# Patient Record
Sex: Male | Born: 1960
Health system: Southern US, Community
[De-identification: ages and names within clinical notes are randomized; demographics above are authoritative.]

## PROBLEM LIST (undated history)

## (undated) DIAGNOSIS — I10 Essential (primary) hypertension: Secondary | ICD-10-CM

## (undated) DIAGNOSIS — E7800 Pure hypercholesterolemia, unspecified: Secondary | ICD-10-CM

## (undated) DIAGNOSIS — F419 Anxiety disorder, unspecified: Secondary | ICD-10-CM

---

## 2013-06-14 DIAGNOSIS — E785 Hyperlipidemia, unspecified: Secondary | ICD-10-CM | POA: Insufficient documentation

## 2013-06-14 DIAGNOSIS — R011 Cardiac murmur, unspecified: Secondary | ICD-10-CM | POA: Insufficient documentation

## 2013-06-14 DIAGNOSIS — I10 Essential (primary) hypertension: Secondary | ICD-10-CM | POA: Insufficient documentation

## 2018-12-11 LAB — BASIC METABOLIC PANEL
BUN: 11 (ref 4–21)
Creatinine: 1 (ref 0.6–1.3)
Glucose: 82
Potassium: 3.9 (ref 3.4–5.3)
Sodium: 138 (ref 137–147)

## 2018-12-11 LAB — HEPATIC FUNCTION PANEL
ALT: 46 — AB (ref 10–40)
AST: 30 (ref 14–40)
Alkaline Phosphatase: 71 (ref 25–125)

## 2018-12-11 LAB — LIPID PANEL
Cholesterol: 97 (ref 0–200)
HDL: 38 (ref 35–70)
LDL Cholesterol: 50
Triglycerides: 47 (ref 40–160)

## 2018-12-11 LAB — PSA: PSA: 2.7

## 2018-12-11 LAB — HEMOGLOBIN A1C: Hemoglobin A1C: 5.3

## 2018-12-28 ENCOUNTER — Other Ambulatory Visit: Payer: Self-pay

## 2018-12-28 ENCOUNTER — Emergency Department: Payer: BLUE CROSS/BLUE SHIELD

## 2018-12-28 ENCOUNTER — Encounter: Payer: Self-pay | Admitting: Emergency Medicine

## 2018-12-28 ENCOUNTER — Ambulatory Visit (INDEPENDENT_AMBULATORY_CARE_PROVIDER_SITE_OTHER)
Admission: EM | Admit: 2018-12-28 | Discharge: 2018-12-28 | Disposition: A | Discharge status: Other Acute Inpatient Hospital | Payer: BLUE CROSS/BLUE SHIELD | Source: Home / Self Care | Attending: Family Medicine | Admitting: Family Medicine

## 2018-12-28 ENCOUNTER — Emergency Department
Admission: EM | Admit: 2018-12-28 | Discharge: 2018-12-28 | Disposition: A | Discharge status: Home / Self Care | Payer: BLUE CROSS/BLUE SHIELD | Attending: Emergency Medicine | Admitting: Emergency Medicine

## 2018-12-28 DIAGNOSIS — I10 Essential (primary) hypertension: Secondary | ICD-10-CM | POA: Diagnosis not present

## 2018-12-28 DIAGNOSIS — F419 Anxiety disorder, unspecified: Secondary | ICD-10-CM | POA: Insufficient documentation

## 2018-12-28 DIAGNOSIS — E785 Hyperlipidemia, unspecified: Secondary | ICD-10-CM | POA: Diagnosis not present

## 2018-12-28 DIAGNOSIS — R079 Chest pain, unspecified: Secondary | ICD-10-CM

## 2018-12-28 DIAGNOSIS — R0600 Dyspnea, unspecified: Secondary | ICD-10-CM | POA: Insufficient documentation

## 2018-12-28 DIAGNOSIS — R0789 Other chest pain: Secondary | ICD-10-CM | POA: Diagnosis not present

## 2018-12-28 DIAGNOSIS — Z1159 Encounter for screening for other viral diseases: Secondary | ICD-10-CM | POA: Insufficient documentation

## 2018-12-28 HISTORY — DX: Anxiety disorder, unspecified: F41.9

## 2018-12-28 HISTORY — DX: Essential (primary) hypertension: I10

## 2018-12-28 HISTORY — DX: Pure hypercholesterolemia, unspecified: E78.00

## 2018-12-28 LAB — BRAIN NATRIURETIC PEPTIDE: B Natriuretic Peptide: 13 pg/mL (ref 0.0–100.0)

## 2018-12-28 LAB — BASIC METABOLIC PANEL
Anion gap: 8 (ref 5–15)
BUN: 9 mg/dL (ref 6–20)
CO2: 26 mmol/L (ref 22–32)
Calcium: 9.8 mg/dL (ref 8.9–10.3)
Chloride: 98 mmol/L (ref 98–111)
Creatinine, Ser: 0.88 mg/dL (ref 0.61–1.24)
GFR calc Af Amer: >60 mL/min (ref >60)
GFR calc non Af Amer: >60 mL/min (ref >60)
Glucose, Bld: 94 mg/dL (ref 70–99)
Potassium: 3.8 mmol/L (ref 3.5–5.1)
Sodium: 132 mmol/L — ABNORMAL LOW (ref 135–145)

## 2018-12-28 LAB — CBC
HCT: 46.4 % (ref 39.0–52.0)
Hemoglobin: 15.3 g/dL (ref 13.0–17.0)
MCH: 27.7 pg (ref 26.0–34.0)
MCHC: 33 g/dL (ref 30.0–36.0)
MCV: 83.9 fL (ref 80.0–100.0)
Platelets: 242 10*3/uL (ref 150–400)
RBC: 5.53 MIL/uL (ref 4.22–5.81)
RDW: 12.5 % (ref 11.5–15.5)
WBC: 4.5 10*3/uL (ref 4.0–10.5)
nRBC: 0 % (ref 0.0–0.2)

## 2018-12-28 LAB — TROPONIN I
Troponin I: <0.03 ng/mL (ref <0.03)
Troponin I: <0.03 ng/mL (ref <0.03)

## 2018-12-28 LAB — SARS CORONAVIRUS 2 BY RT PCR (HOSPITAL ORDER, PERFORMED IN ~~LOC~~ HOSPITAL LAB): SARS Coronavirus 2: NEGATIVE

## 2018-12-28 MED ORDER — DIAZEPAM 5 MG PO TABS: 5.0000 mg | ORAL_TABLET | Freq: Three times a day (TID) | ORAL | 0 refills | Status: AC | PRN

## 2018-12-28 MED ORDER — ASPIRIN 81 MG PO CHEW
324.0000 mg | CHEWABLE_TABLET | Freq: Once | ORAL | Status: AC
  Administered 2018-12-28: 324 mg via ORAL

## 2018-12-28 MED ORDER — SODIUM CHLORIDE 0.9% FLUSH: 3.0000 mL | Freq: Once | INTRAVENOUS | Status: DC

## 2018-12-28 MED ORDER — DIAZEPAM 5 MG PO TABS
10.0000 mg | ORAL_TABLET | Freq: Once | ORAL | Status: AC
  Administered 2018-12-28: 10 mg via ORAL
  Filled 2018-12-28: qty 2

## 2018-12-28 NOTE — ED Provider Notes (Signed)
[facility] Emergency Department Provider Note       Time seen: ----------------------------------------- 12:53 PM on 12/28/2018 -----------------------------------------   I have reviewed the triage vital signs and the nursing notes.  HISTORY   Chief Complaint Chest Pain    HPI Anthony Padilla is a 58 y.o. male with a history of anxiety, hyperlipidemia, hypertension who presents to the ED for chest pain since this morning.  Patient reports pain rating into his jaw.  He arrives alert and oriented, received aspirin prior to arrival at [facility] urgent care with some improvement.  Heart rate is normally a little slow.  He reports a history of anxiety and he took anxiety medicines without any improvement.  Current pain is 2 out of 10.  Past Medical History:  Diagnosis Date  . Anxiety   . High cholesterol   . Hypertension     There are no active problems to display for this patient.   History reviewed. No pertinent surgical history.  Allergies Patient has no known allergies.  Social History Social History   Tobacco Use  . Smoking status: Never Smoker  . Smokeless tobacco: Never Used  Substance Use Topics  . Alcohol use: Not Currently  . Drug use: Never   Review of Systems Constitutional: Negative for fever. Cardiovascular: Positive for chest pain Respiratory: Negative for shortness of breath. Gastrointestinal: Negative for abdominal pain, vomiting and diarrhea. Musculoskeletal: Negative for back pain. Skin: Negative for rash. Neurological: Negative for headaches, focal weakness or numbness.  All systems negative/normal/unremarkable except as stated in the HPI  ____________________________________________   PHYSICAL EXAM:  VITAL SIGNS: ED Triage Vitals  Enc Vitals Group     BP 12/28/18 1209 (!) 159/96     Pulse Rate 12/28/18 1209 60     Resp 12/28/18 1207 16     Temp 12/28/18 1209 97.9 F (36.6 C)     Temp Source 12/28/18 1209 Oral    SpO2 12/28/18 1209 98 %     Weight 12/28/18 1208 162 lb (73.5 kg)     Height 12/28/18 1208 5\' 8"  (1.727 m)     Head Circumference --      Peak Flow --      Pain Score 12/28/18 1207 2     Pain Loc --      Pain Edu? --      Excl. in GC? --    Constitutional: Alert and oriented. Well appearing and in no distress. Eyes: Conjunctivae are normal. Normal extraocular movements. ENT      Head: Normocephalic and atraumatic.      Nose: No congestion/rhinnorhea.      Mouth/Throat: Mucous membranes are moist.      Neck: No stridor. Cardiovascular: Normal rate, regular rhythm. No murmurs, rubs, or gallops. Respiratory: Normal respiratory effort without tachypnea nor retractions. Breath sounds are clear and equal bilaterally. No wheezes/rales/rhonchi. Gastrointestinal: Soft and nontender. Normal bowel sounds Musculoskeletal: Nontender with normal range of motion in extremities. No lower extremity tenderness nor edema. Neurologic:  Normal speech and language. No gross focal neurologic deficits are appreciated.  Skin:  Skin is warm, dry and intact. No rash noted. Psychiatric: Mood and affect are normal. Speech and behavior are normal.  ____________________________________________  EKG: Interpreted by me.  Sinus rhythm with rate of 60 bpm, LVH, borderline ST segment and T wave abnormalities, normal axis, normal QT  ____________________________________________  ED COURSE:  As part of my medical decision making, I reviewed the following data within the electronic MEDICAL RECORD NUMBER  History obtained from family if available, nursing notes, old chart and ekg, as well as notes from prior ED visits. Patient presented for chest pain, we will assess with labs and imaging as indicated at this time.   Procedures  Anthony Padilla was evaluated in Emergency Department on 12/28/2018 for the symptoms described in the history of present illness. He was evaluated in the context of the global COVID-19 pandemic, which  necessitated consideration that the patient might be at risk for infection with the SARS-CoV-2 virus that causes COVID-19. Institutional protocols and algorithms that pertain to the evaluation of patients at risk for COVID-19 are in a state of rapid change based on information released by regulatory bodies including the CDC and federal and state organizations. These policies and algorithms were followed during the patient's care in the ED.  ____________________________________________   LABS (pertinent positives/negatives)  Labs Reviewed  BASIC METABOLIC PANEL - Abnormal; Notable for the following components:      Result Value   Sodium 132 (*)    All other components within normal limits  SARS CORONAVIRUS 2 (HOSPITAL ORDER, PERFORMED IN [facility])  CBC  TROPONIN I  BRAIN NATRIURETIC PEPTIDE  TROPONIN I    RADIOLOGY Images were viewed by me  Chest x-ray IMPRESSION: No active cardiopulmonary disease. ____________________________________________   DIFFERENTIAL DIAGNOSIS   Unstable angina, MI, anxiety, musculoskeletal pain  FINAL ASSESSMENT AND PLAN  Chest pain  Plan: The patient had presented for chest pain. Patient's labs are unremarkable including repeat troponin. Patient's imaging was negative.  His EKG is abnormal but he has told me that his EKG is always abnormal.  I will refer him to cardiology for outpatient follow-up.   Anthony DashJohnathan E Kavonte Bearse, MD    Note: This note was generated in part or whole with voice recognition software. Voice recognition is usually quite accurate but there are transcription errors that can and very often do occur. I apologize for any typographical errors that were not detected and corrected.     Emily FilbertWilliams, Daune Divirgilio E, MD 12/28/18 [phone]

## 2018-12-28 NOTE — ED Provider Notes (Signed)
[facility]    CSN: [id number] Arrival date & time: 12/28/18  1014     History   Chief Complaint Chief Complaint  Patient presents with  . Chest Pain    HPI Anthony Padilla is a 58 y.o. male.   58 yo male with a h/o hypertension, hypercholesterolemia and anxiety presents with a c/o chest pains since yesterday. States pain in center of chest and associated with shortness of breath and left shoulder pain. Patient states had one episode yesterday and another one since this morning. Currently pain 2-3/10.    Chest Pain    Past Medical History:  Diagnosis Date  . Anxiety   . High cholesterol   . Hypertension     There are no active problems to display for this patient.   History reviewed. No pertinent surgical history.     Home Medications    Prior to Admission medications   Medication Sig Start Date End Date Taking? Authorizing Provider  ALPRAZolam Prudy Feeler) 0.25 MG tablet Take 0.25 mg by mouth at bedtime as needed for anxiety.   Yes [provider]  rosuvastatin (CRESTOR) 10 MG tablet Take 10 mg by mouth daily.   Yes [provider]  valsartan (DIOVAN) 160 MG tablet Take 160 mg by mouth daily.   Yes [provider]    Family History Family History  Problem Relation Age of Onset  . Hypertension Mother   . Heart failure Father     Social History Social History   Tobacco Use  . Smoking status: Never Smoker  . Smokeless tobacco: Never Used  Substance Use Topics  . Alcohol use: Not Currently  . Drug use: Not on file     Allergies   Patient has no known allergies.   Review of Systems Review of Systems  Cardiovascular: Positive for chest pain.     Physical Exam Triage Vital Signs ED Triage Vitals  Enc Vitals Group     BP 12/28/18 1036 128/81     Pulse Rate 12/28/18 1036 (!) 55     Resp 12/28/18 1036 16     Temp 12/28/18 1036 97.8 F (36.6 C)     Temp Source 12/28/18 1036 Oral     SpO2 12/28/18 1036 100 %    Weight 12/28/18 1031 162 lb (73.5 kg)     Height 12/28/18 1031 5\' 8"  (1.727 m)     Head Circumference --      Peak Flow --      Pain Score 12/28/18 1030 4     Pain Loc --      Pain Edu? --      Excl. in GC? --    No data found.  Updated Vital Signs BP 128/81 (BP Location: Right Arm)   Pulse (!) 55   Temp 97.8 F (36.6 C) (Oral)   Resp 16   Ht 5\' 8"  (1.727 m)   Wt 73.5 kg   SpO2 100%   BMI 24.63 kg/m   Visual Acuity Right Eye Distance:   Left Eye Distance:   Bilateral Distance:    Right Eye Near:   Left Eye Near:    Bilateral Near:     Physical Exam Vitals signs and nursing note reviewed.  Constitutional:      General: He is not in acute distress.    Appearance: He is not toxic-appearing or diaphoretic.  Cardiovascular:     Rate and Rhythm: Bradycardia present.     Pulses: Normal pulses.  Pulmonary:  Effort: Pulmonary effort is normal. No respiratory distress.     Breath sounds: Normal breath sounds. No stridor. No wheezing or rhonchi.  Neurological:     Mental Status: He is alert.      UC Treatments / Results  Labs (all labs ordered are listed, but only abnormal results are displayed) Labs Reviewed - No data to display  EKG None  Radiology No results found.  Procedures ED EKG Date/Time: 12/28/2018 11:56 AM Performed by: Payton Mccallumonty, Rufus Cypert, MD Authorized by: Payton Mccallumonty, Priyah Schmuck, MD   ECG reviewed by ED Physician in the absence of a cardiologist: yes   Previous ECG:    Previous ECG:  Unavailable Interpretation:    Interpretation: abnormal   Rate:    ECG rate assessment: bradycardic   Rhythm:    Rhythm: sinus bradycardia   Ectopy:    Ectopy: none   QRS:    QRS axis:  Normal ST segments:    ST segments:  Non-specific T waves:    T waves: inverted     Inverted:  V3   (including critical care time)  Medications Ordered in UC Medications  aspirin chewable tablet 324 mg (324 mg Oral Given 12/28/18 1120)    Initial Impression / Assessment and  Plan / UC Course  I have reviewed the triage vital signs and the nursing notes.  Pertinent labs & imaging results that were available during my care of the patient were reviewed by me and considered in my medical decision making (see chart for details).      Final Clinical Impressions(s) / UC Diagnoses   Final diagnoses:  Nonspecific chest pain    ED Prescriptions    None     1. ekg result and possible diagnoses reviewed with patient; due to symptoms and cardiac risk factors, recommend patient go to Emergency Department for further evaluation and management; patient given ASA 324mg  po; patient in stable condition transported by EMS.   Controlled Substance Prescriptions [place] Controlled Substance Registry consulted? Not Applicable   Payton Mccallumonty, Genie Wenke, MD 12/28/18 1200

## 2018-12-28 NOTE — ED Triage Notes (Addendum)
Pt from UC via EMS with c/o CP since this am, pain radiating into jaw. PT A&OX4, ambulatory, NAD noted , VSS. Per EMS pt HR into 50s but pt states normal for him. Hx of anxiety. 324 of Asprin given PTA

## 2018-12-28 NOTE — ED Triage Notes (Signed)
Patient states he has been having chest pain and shortness of breath.  Patient states he has a history of anxiety and takes medicine for it.  Patient states the medicine is not helping now.

## 2019-04-20 ENCOUNTER — Other Ambulatory Visit: Payer: Self-pay

## 2019-04-20 ENCOUNTER — Ambulatory Visit (INDEPENDENT_AMBULATORY_CARE_PROVIDER_SITE_OTHER): Payer: BC Managed Care – PPO | Admitting: Family Medicine

## 2019-04-20 ENCOUNTER — Encounter: Payer: Self-pay | Admitting: Family Medicine

## 2019-04-20 VITALS — BP 122/80 | HR 60 | Ht 68.0 in | Wt 168.0 lb

## 2019-04-20 DIAGNOSIS — Z23 Encounter for immunization: Secondary | ICD-10-CM | POA: Diagnosis not present

## 2019-04-20 DIAGNOSIS — I10 Essential (primary) hypertension: Secondary | ICD-10-CM | POA: Diagnosis not present

## 2019-04-20 DIAGNOSIS — E782 Mixed hyperlipidemia: Secondary | ICD-10-CM | POA: Diagnosis not present

## 2019-04-20 DIAGNOSIS — Z8659 Personal history of other mental and behavioral disorders: Secondary | ICD-10-CM | POA: Diagnosis not present

## 2019-04-20 DIAGNOSIS — Z7689 Persons encountering health services in other specified circumstances: Secondary | ICD-10-CM | POA: Diagnosis not present

## 2019-04-20 MED ORDER — VALSARTAN-HYDROCHLOROTHIAZIDE 80-12.5 MG PO TABS: 2.0000 | ORAL_TABLET | Freq: Every day | ORAL | 1 refills | Status: DC

## 2019-04-20 NOTE — Progress Notes (Signed)
Date:  04/20/2019   Name:  Anthony Padilla   DOB:  1960/08/16   MRN:  [id number]   Chief Complaint: Establish Care, Hyperlipidemia (recheck lipids), and influenza vacc need  Patient is a 58 year old male who presents for a establish care exam. The patient reports the following problems: history hyperlipemia/hypertension. Health maintenance has been reviewed up to date.  Hyperlipidemia This is a chronic problem. The problem is controlled. Recent lipid tests were reviewed and are normal. He has no history of chronic renal disease, diabetes, hypothyroidism, liver disease, obesity or nephrotic syndrome. There are no known factors aggravating his hyperlipidemia. Pertinent negatives include no chest pain, focal sensory loss, focal weakness, leg pain, myalgias or shortness of breath. Current antihyperlipidemic treatment includes statins. The current treatment provides moderate improvement of lipids. There are no compliance problems.  Risk factors for coronary artery disease include hypertension, dyslipidemia and male sex.  Hypertension This is a chronic problem. The current episode started more than 1 year ago. The problem is unchanged. The problem is controlled. Pertinent negatives include no anxiety, blurred vision, chest pain, headaches, malaise/fatigue, neck pain, orthopnea, palpitations, peripheral edema, PND, shortness of breath or sweats. There are no associated agents to hypertension. Risk factors for coronary artery disease include dyslipidemia. There is no history of chronic renal disease.    Review of Systems  Constitutional: Negative for chills, fever and malaise/fatigue.  HENT: Negative for drooling, ear discharge, ear pain and sore throat.   Eyes: Negative for blurred vision.  Respiratory: Negative for cough, shortness of breath and wheezing.   Cardiovascular: Negative for chest pain, palpitations, orthopnea, leg swelling and PND.  Gastrointestinal: Negative for abdominal  pain, blood in stool, constipation, diarrhea and nausea.  Endocrine: Negative for polydipsia.  Genitourinary: Negative for dysuria, frequency, hematuria and urgency.  Musculoskeletal: Negative for back pain, myalgias and neck pain.  Skin: Negative for rash.  Allergic/Immunologic: Negative for environmental allergies.  Neurological: Negative for dizziness, focal weakness and headaches.  Hematological: Does not bruise/bleed easily.  Psychiatric/Behavioral: Negative for suicidal ideas. The patient is not nervous/anxious.     Patient Active Problem List   Diagnosis Date Noted  . Hyperlipidemia 06/14/2013  . Hypertension 06/14/2013  . Systolic murmur 06/14/2013    No Known Allergies  No past surgical history on file.  Social History   Tobacco Use  . Smoking status: Never Smoker  . Smokeless tobacco: Never Used  Substance Use Topics  . Alcohol use: Not Currently  . Drug use: Never     Medication list has been reviewed and updated.  Current Meds  Medication Sig  . escitalopram (LEXAPRO) 20 MG tablet Take 20 mg by mouth daily.  . valsartan-hydrochlorothiazide (DIOVAN-HCT) 80-12.5 MG tablet Take 2 tablets by mouth daily.  . [DISCONTINUED] escitalopram (LEXAPRO) 10 MG tablet Take 10 mg by mouth daily.    No flowsheet data found.  BP Readings from Last 3 Encounters:  04/20/19 122/80  12/28/18 123/82  12/28/18 128/81    Physical Exam Vitals signs and nursing note reviewed.  HENT:     Head: Normocephalic.     Right Ear: External ear normal.     Left Ear: External ear normal.     Nose: Nose normal.  Eyes:     General: No scleral icterus.       Right eye: No discharge.        Left eye: No discharge.     Conjunctiva/sclera: Conjunctivae normal.     Pupils:  Pupils are equal, round, and reactive to light.  Neck:     Musculoskeletal: Normal range of motion and neck supple.     Thyroid: No thyromegaly.     Vascular: No JVD.     Trachea: No tracheal deviation.   Cardiovascular:     Rate and Rhythm: Normal rate and regular rhythm.     Heart sounds: Normal heart sounds. No murmur. No friction rub. No gallop.   Pulmonary:     Effort: No respiratory distress.     Breath sounds: Normal breath sounds. No wheezing or rales.  Abdominal:     General: Bowel sounds are normal.     Palpations: Abdomen is soft. There is no mass.     Tenderness: There is no abdominal tenderness. There is no guarding or rebound.  Musculoskeletal: Normal range of motion.        General: No tenderness.  Lymphadenopathy:     Cervical: No cervical adenopathy.  Skin:    General: Skin is warm.     Findings: No rash.  Neurological:     Mental Status: He is alert and oriented to person, place, and time.     Cranial Nerves: No cranial nerve deficit.     Deep Tendon Reflexes: Reflexes are normal and symmetric.     Wt Readings from Last 3 Encounters:  04/20/19 168 lb (76.2 kg)  12/28/18 162 lb (73.5 kg)  12/28/18 162 lb (73.5 kg)    BP 122/80   Pulse 60   Ht 5\' 8"  (1.727 m)   Wt 168 lb (76.2 kg)   BMI 25.54 kg/m   Assessment and Plan: 1. Establishing care with new doctor, encounter for Patient is establishing care with a new physician.  Patient's previous encounters are somewhat limited but have been reviewed.  Patient brought lab work for [place] to review of renal panel and lipid panel and they were done in Dec 04, 2018.  2. Essential hypertension Chronic.  Controlled.  Continue valsartan hydrochlorothiazide 80- review of creatinine and GFR from above reading is unremarkable.  Electrolytes were also noted to be normal-12.5 mg. - valsartan-hydrochlorothiazide (DIOVAN-HCT) 80-12.5 MG tablet; Take 2 tablets by mouth daily.  Dispense: 90 tablet; Refill: 1  3. Moderate mixed hyperlipidemia not requiring statin therapy Patient has a history of mixed hyperlipidemia.  Previously he was on Crestor but has not taken it for months until for some reason he took 1 dose last night.   Patient also had breakfast today but did not have much.  We will get a lipid panel and we will wait and draw it in a fasting state tomorrow morning. - Lipid Panel With LDL/HDL Ratio  4. History of anxiety Patient with a history of anxiety which is currently being followed by behavioral medicine at another site.  Patient is currently getting medications for his panic attacks and that we will continue at that site.  5. Influenza vaccine needed Discussed and administered - Flu Vaccine QUAD 36+ mos IM

## 2019-04-21 DIAGNOSIS — R748 Abnormal levels of other serum enzymes: Secondary | ICD-10-CM | POA: Diagnosis not present

## 2019-04-22 ENCOUNTER — Telehealth: Payer: Self-pay

## 2019-04-22 ENCOUNTER — Other Ambulatory Visit: Payer: Self-pay

## 2019-04-22 LAB — LIPID PANEL WITH LDL/HDL RATIO
Cholesterol, Total: 182 mg/dL (ref 100–199)
HDL: 44 mg/dL (ref >39)
LDL Chol Calc (NIH): 122 mg/dL — ABNORMAL HIGH (ref 0–99)
LDL/HDL Ratio: 2.8 ratio (ref 0.0–3.6)
Triglycerides: 84 mg/dL (ref 0–149)
VLDL Cholesterol Cal: 16 mg/dL (ref 5–40)

## 2019-04-22 NOTE — Telephone Encounter (Signed)
Pt called re labs had some questions about his labs

## 2019-04-22 NOTE — Progress Notes (Unsigned)
Added labs on R47.8 Hepatic [phone]

## 2019-04-22 NOTE — Telephone Encounter (Signed)
Added labs to existing order- called [facility]

## 2019-04-25 LAB — HEPATIC FUNCTION PANEL
ALT: 42 IU/L (ref 0–44)
AST: 31 IU/L (ref 0–40)
Albumin: 4.4 g/dL (ref 3.8–4.9)
Alkaline Phosphatase: 64 IU/L (ref 39–117)
Bilirubin Total: 0.3 mg/dL (ref 0.0–1.2)
Bilirubin, Direct: 0.08 mg/dL (ref 0.00–0.40)
Total Protein: 7.3 g/dL (ref 6.0–8.5)

## 2019-04-25 LAB — SPECIMEN STATUS REPORT

## 2019-05-28 ENCOUNTER — Ambulatory Visit (INDEPENDENT_AMBULATORY_CARE_PROVIDER_SITE_OTHER): Payer: BC Managed Care – PPO | Admitting: Family Medicine

## 2019-05-28 ENCOUNTER — Ambulatory Visit
Admission: RE | Admit: 2019-05-28 | Discharge: 2019-05-28 | Disposition: A | Discharge status: Home / Self Care | Payer: BLUE CROSS/BLUE SHIELD | Source: Ambulatory Visit | Attending: Family Medicine | Admitting: Family Medicine

## 2019-05-28 ENCOUNTER — Other Ambulatory Visit: Payer: Self-pay

## 2019-05-28 ENCOUNTER — Ambulatory Visit
Admission: RE | Admit: 2019-05-28 | Discharge: 2019-05-28 | Disposition: A | Discharge status: Home / Self Care | Payer: BLUE CROSS/BLUE SHIELD | Attending: Family Medicine | Admitting: Family Medicine

## 2019-05-28 VITALS — BP 142/84 | HR 64 | Ht 68.0 in | Wt 160.0 lb

## 2019-05-28 DIAGNOSIS — M5412 Radiculopathy, cervical region: Secondary | ICD-10-CM | POA: Insufficient documentation

## 2019-05-28 DIAGNOSIS — I10 Essential (primary) hypertension: Secondary | ICD-10-CM

## 2019-05-28 NOTE — Progress Notes (Signed)
Date:  05/28/2019   Name:  Anthony Padilla   DOB:  05/14/1961   MRN:  [id number]   Chief Complaint: Hypertension (having readings around 135-140/ 84-90- only taking 1 on valsartan instead of 2) and Numbness (presses on arm and finger is numb)  Hypertension This is a chronic problem. The current episode started more than 1 year ago. The problem has been waxing and waning since onset. The problem is controlled. Pertinent negatives include no anxiety, blurred vision, chest pain, headaches, malaise/fatigue, neck pain, orthopnea, palpitations, peripheral edema, PND, shortness of breath or sweats. There are no associated agents to hypertension. There are no known risk factors for coronary artery disease. Past treatments include angiotensin blockers and diuretics. The current treatment provides moderate improvement. There are no compliance problems.  There is no history of angina, kidney disease, CAD/MI, CVA, heart failure, left ventricular hypertrophy, PVD or retinopathy.  Neck Pain  This is a recurrent problem. The current episode started more than 1 month ago. The problem occurs intermittently. The problem has been waxing and waning. The pain is associated with nothing. The pain is present in the left side. The quality of the pain is described as aching. The pain is moderate. Associated symptoms include numbness and tingling. Pertinent negatives include no chest pain, fever, headaches, leg pain, paresis, photophobia, syncope or weakness. He has tried nothing for the symptoms.    Review of Systems  Constitutional: Negative for chills, fever and malaise/fatigue.  HENT: Negative for drooling, ear discharge, ear pain and sore throat.   Eyes: Negative for blurred vision and photophobia.  Respiratory: Negative for cough, shortness of breath and wheezing.   Cardiovascular: Negative for chest pain, palpitations, orthopnea, leg swelling, syncope and PND.  Gastrointestinal: Negative for abdominal pain,  blood in stool, constipation, diarrhea and nausea.  Endocrine: Negative for polydipsia.  Genitourinary: Negative for dysuria, frequency, hematuria and urgency.  Musculoskeletal: Negative for back pain, myalgias and neck pain.  Skin: Negative for rash.  Allergic/Immunologic: Negative for environmental allergies.  Neurological: Positive for tingling and numbness. Negative for dizziness, weakness and headaches.  Hematological: Does not bruise/bleed easily.  Psychiatric/Behavioral: Negative for suicidal ideas. The patient is not nervous/anxious.     Patient Active Problem List   Diagnosis Date Noted  . Hyperlipidemia 06/14/2013  . Hypertension 06/14/2013  . Systolic murmur 06/14/2013    No Known Allergies  No past surgical history on file.  Social History   Tobacco Use  . Smoking status: Never Smoker  . Smokeless tobacco: Never Used  Substance Use Topics  . Alcohol use: Not Currently  . Drug use: Never     Medication list has been reviewed and updated.  Current Meds  Medication Sig  . escitalopram (LEXAPRO) 20 MG tablet Take 20 mg by mouth daily.  . rosuvastatin (CRESTOR) 20 MG tablet Take 20 mg by mouth daily.  . valsartan-hydrochlorothiazide (DIOVAN-HCT) 80-12.5 MG tablet Take 2 tablets by mouth daily.    PHQ 2/9 Scores 05/28/2019  PHQ - 2 Score 0  PHQ- 9 Score 0    BP Readings from Last 3 Encounters:  05/28/19 (!) 142/84  04/20/19 122/80  12/28/18 123/82    Physical Exam Vitals signs and nursing note reviewed.  HENT:     Head: Normocephalic.     Right Ear: Tympanic membrane and external ear normal.     Left Ear: Tympanic membrane and external ear normal.     Nose: Nose normal.  Eyes:     General:  No scleral icterus.       Right eye: No discharge.        Left eye: No discharge.     Conjunctiva/sclera: Conjunctivae normal.     Pupils: Pupils are equal, round, and reactive to light.  Neck:     Musculoskeletal: Normal range of motion and neck supple.      Thyroid: No thyromegaly.     Vascular: No JVD.     Trachea: No tracheal deviation.  Cardiovascular:     Rate and Rhythm: Normal rate and regular rhythm.     Heart sounds: Normal heart sounds. No murmur. No friction rub. No gallop.   Pulmonary:     Effort: No respiratory distress.     Breath sounds: Normal breath sounds. No wheezing, rhonchi or rales.  Abdominal:     General: Bowel sounds are normal.     Palpations: Abdomen is soft. There is no mass.     Tenderness: There is no abdominal tenderness. There is no guarding or rebound.  Musculoskeletal: Normal range of motion.        General: No tenderness.  Lymphadenopathy:     Cervical: No cervical adenopathy.  Skin:    General: Skin is warm.     Capillary Refill: Capillary refill takes less than 2 seconds.     Findings: No rash.  Neurological:     Mental Status: He is alert and oriented to person, place, and time.     Cranial Nerves: No cranial nerve deficit.     Sensory: Sensation is intact.     Motor: Motor function is intact.     Deep Tendon Reflexes: Reflexes are normal and symmetric.     Reflex Scores:      Tricep reflexes are 2+ on the right side and 2+ on the left side.      Bicep reflexes are 2+ on the right side and 2+ on the left side.      Brachioradialis reflexes are 2+ on the right side and 2+ on the left side.      Patellar reflexes are 2+ on the right side and 2+ on the left side.      Achilles reflexes are 2+ on the right side and 2+ on the left side.    Comments: Tinel positive right/     Wt Readings from Last 3 Encounters:  05/28/19 160 lb (72.6 kg)  04/20/19 168 lb (76.2 kg)  12/28/18 162 lb (73.5 kg)    BP (!) 142/84   Pulse 64   Ht 5\' 8"  (1.727 m)   Wt 160 lb (72.6 kg)   SpO2 99%   BMI 24.33 kg/m   Assessment and Plan:   1. Essential hypertension Chronic.  Patient has had a misunderstanding about the dosing of his medication.  Patient has only been taking 80-12.5 valsartan hydrochlorothiazide.   There is mild elevation of his blood pressure and this is pointing out to the patient that he was to be taking 2 of these because of the backorder of his 160-25 mg combination.  Patient has called back and noted that he was only taking 1 and will start to take 2 of these in the meantime.  2. Cervical radiculopathy New problem.  Persistence.  No history of trauma.  Patient then also noted that he is having some discomfort going down into his arm on the left which actually is from the shoulder down in his radiating from his cervical aspect of his spine.  This is  consistent with radiculopathy and we will get an x-ray of his neck to evaluate this. - DG Cervical Spine Complete; Future

## 2019-05-28 NOTE — Patient Instructions (Signed)

## 2019-06-03 ENCOUNTER — Other Ambulatory Visit: Payer: Self-pay

## 2019-06-03 DIAGNOSIS — M5412 Radiculopathy, cervical region: Secondary | ICD-10-CM

## 2019-06-03 MED ORDER — MELOXICAM 7.5 MG PO TABS: 7.5000 mg | ORAL_TABLET | Freq: Every day | ORAL | 0 refills | Status: AC

## 2019-06-03 NOTE — Progress Notes (Unsigned)
Sent in meloxicam

## 2019-07-20 ENCOUNTER — Other Ambulatory Visit: Payer: BC Managed Care – PPO

## 2019-07-20 ENCOUNTER — Other Ambulatory Visit: Payer: Self-pay

## 2019-07-20 DIAGNOSIS — E782 Mixed hyperlipidemia: Secondary | ICD-10-CM

## 2019-07-21 LAB — LIPID PANEL WITH LDL/HDL RATIO
Cholesterol, Total: 187 mg/dL (ref 100–199)
HDL: 42 mg/dL (ref >39)
LDL Chol Calc (NIH): 133 mg/dL — ABNORMAL HIGH (ref 0–99)
LDL/HDL Ratio: 3.2 ratio (ref 0.0–3.6)
Triglycerides: 65 mg/dL (ref 0–149)
VLDL Cholesterol Cal: 12 mg/dL (ref 5–40)

## 2019-11-25 IMAGING — CR CHEST - 2 VIEW
1 series · 2 of 2 positions shown · non-contrast
Comparison: None.

CLINICAL DATA: Mid chest pain.

EXAM:
CHEST - 2 VIEW

[Series 1: dg chest 2 view · 0.14mm/px · 2 of 2 slices shown]
[im 1/2]
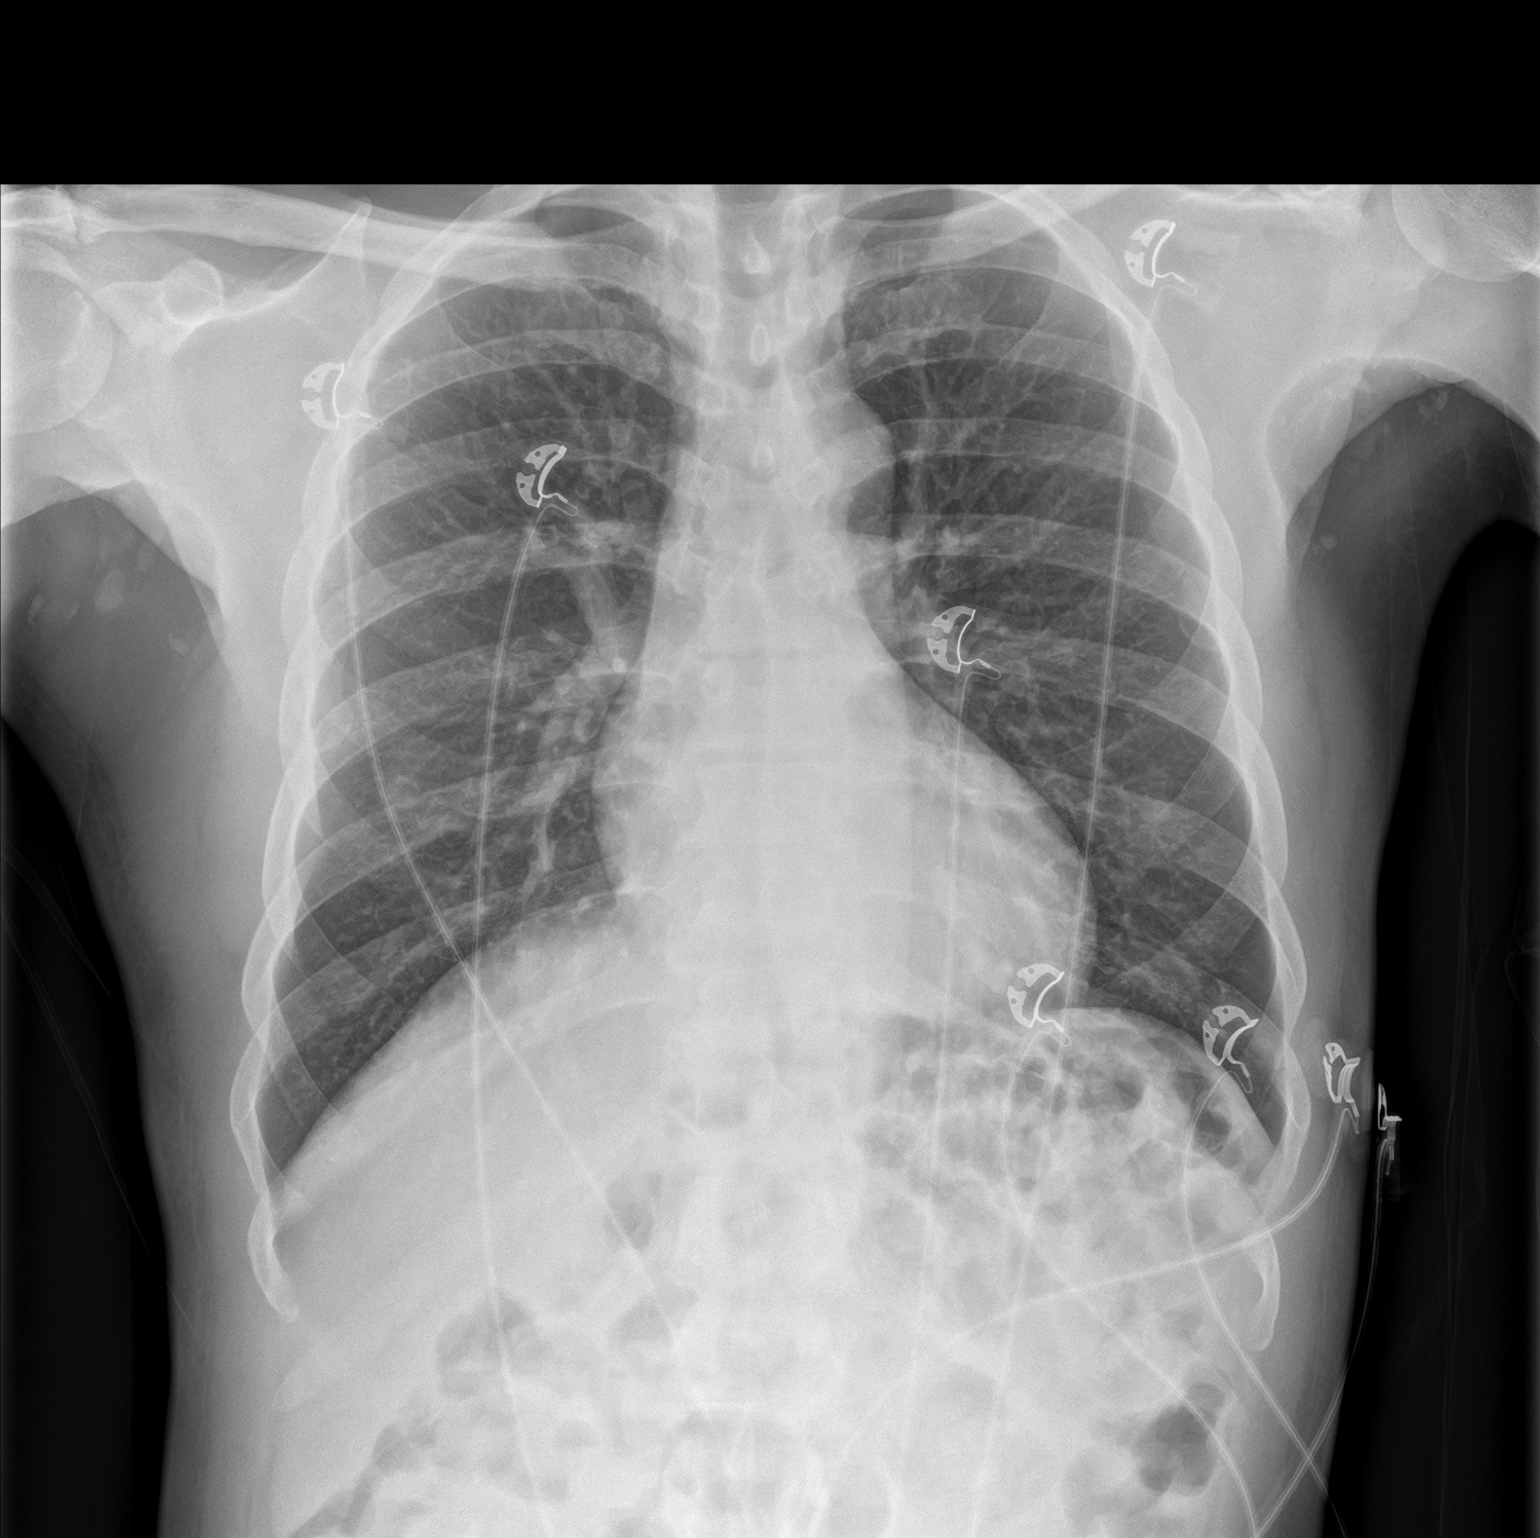
[im 2/2]
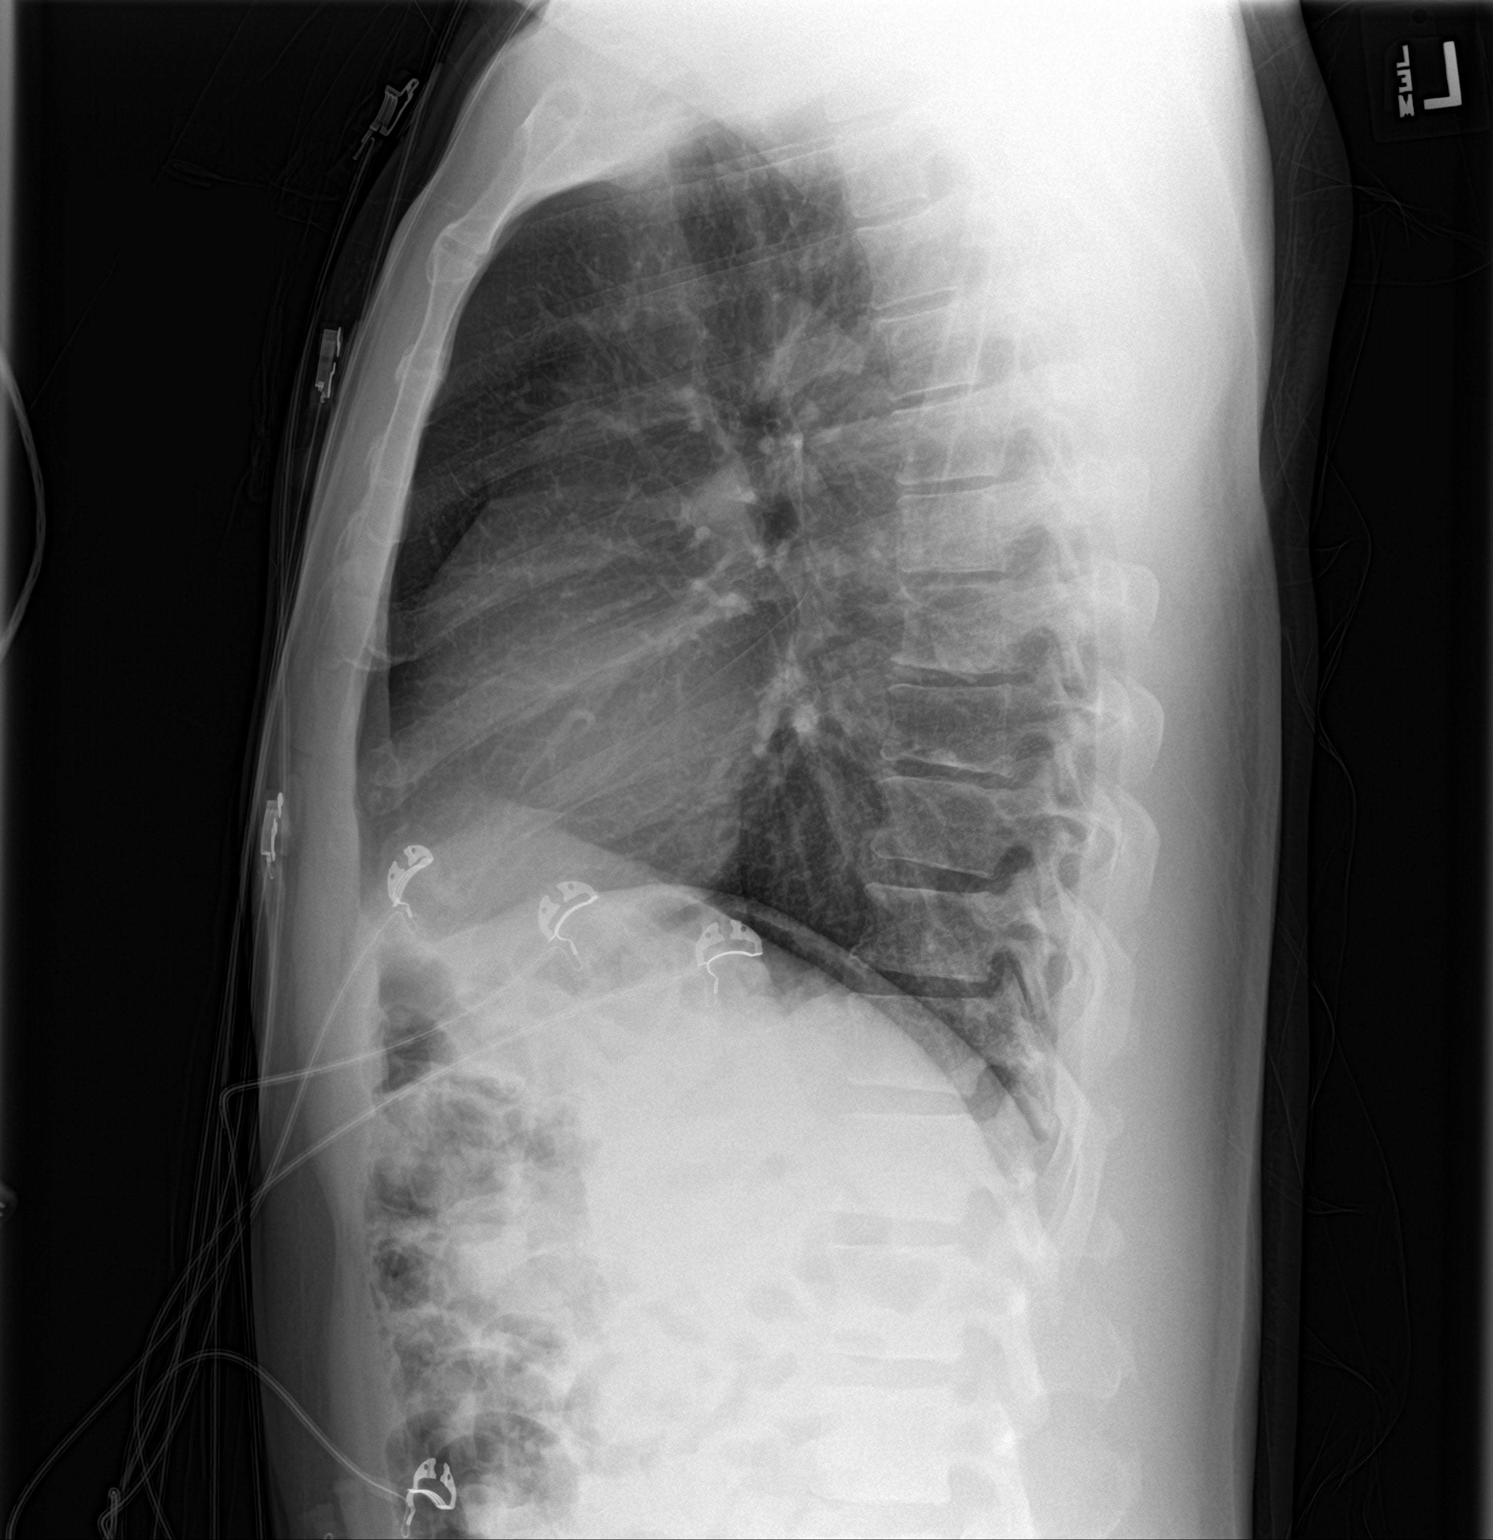

[2 of 2 positions shown; findings below may reference images not displayed]

FINDINGS: Both lungs are clear. Heart and mediastinum are within normal
limits. Trachea is midline. No pleural effusions. No acute bone
abnormality.
IMPRESSION: No active cardiopulmonary disease.

## 2019-12-16 ENCOUNTER — Other Ambulatory Visit: Payer: Self-pay | Admitting: Family Medicine

## 2019-12-16 DIAGNOSIS — I10 Essential (primary) hypertension: Secondary | ICD-10-CM

## 2019-12-16 NOTE — Telephone Encounter (Signed)
Requested medication (s) are due for refill today -yes  Requested medication (s) are on the active medication list -yes  Future visit scheduled -no  Last refill: 06/19/19  Notes to clinic: Attempted to call patient to schedule follow up- VM disfunctioning and would nor recird- unable to leave message- request sent for review  Requested Prescriptions  Pending Prescriptions Disp Refills   valsartan-hydrochlorothiazide (DIOVAN-HCT) 80-12.5 MG tablet [Pharmacy Med Name: Valsartan-hydroCHLOROthiazide 80-12.5 MG Oral Tablet] 90 tablet 0    Sig: Take 2 tablets by mouth once daily      Cardiovascular: ARB + Diuretic Combos Failed - 12/16/2019  9:51 AM      Failed - K in normal range and within 180 days    Potassium  Date Value Ref Range Status  12/28/2018 3.8 3.5 - 5.1 mmol/L Final          Failed - Na in normal range and within 180 days    Sodium  Date Value Ref Range Status  12/28/2018 132 (L) 135 - 145 mmol/L Final  12/11/2018 138 137 - 147 Final          Failed - Cr in normal range and within 180 days    Creatinine, Ser  Date Value Ref Range Status  12/28/2018 0.88 0.61 - 1.24 mg/dL Final          Failed - Ca in normal range and within 180 days    Calcium  Date Value Ref Range Status  12/28/2018 9.8 8.9 - 10.3 mg/dL Final          Failed - Last BP in normal range    BP Readings from Last 1 Encounters:  05/28/19 (!) 142/84          Failed - Valid encounter within last 6 months    Recent Outpatient Visits           6 months ago Essential hypertension   [facility] Duanne Limerick, MD   8 months ago Establishing care with new doctor, encounter for   [facility] Duanne Limerick, MD              Passed - Patient is not pregnant          Requested Prescriptions  Pending Prescriptions Disp Refills   valsartan-hydrochlorothiazide (DIOVAN-HCT) 80-12.5 MG tablet [Pharmacy Med Name: Valsartan-hydroCHLOROthiazide 80-12.5 MG Oral Tablet] 90  tablet 0    Sig: Take 2 tablets by mouth once daily      Cardiovascular: ARB + Diuretic Combos Failed - 12/16/2019  9:51 AM      Failed - K in normal range and within 180 days    Potassium  Date Value Ref Range Status  12/28/2018 3.8 3.5 - 5.1 mmol/L Final          Failed - Na in normal range and within 180 days    Sodium  Date Value Ref Range Status  12/28/2018 132 (L) 135 - 145 mmol/L Final  12/11/2018 138 137 - 147 Final          Failed - Cr in normal range and within 180 days    Creatinine, Ser  Date Value Ref Range Status  12/28/2018 0.88 0.61 - 1.24 mg/dL Final          Failed - Ca in normal range and within 180 days    Calcium  Date Value Ref Range Status  12/28/2018 9.8 8.9 - 10.3 mg/dL Final  Failed - Last BP in normal range    BP Readings from Last 1 Encounters:  05/28/19 (!) 142/84          Failed - Valid encounter within last 6 months    Recent Outpatient Visits           6 months ago Essential hypertension   [place], [place]   8 months ago Establishing care with new doctor, encounter for   [place], [place]              Passed - Patient is not pregnant

## 2019-12-31 ENCOUNTER — Other Ambulatory Visit: Payer: Self-pay | Admitting: Family Medicine

## 2019-12-31 DIAGNOSIS — I10 Essential (primary) hypertension: Secondary | ICD-10-CM

## 2019-12-31 NOTE — Telephone Encounter (Signed)
Requested medication (s) are due for refill today: Yes  Requested medication (s) are on the active medication list: Yes  Last refill:  12/16/19  Future visit scheduled: No  Notes to clinic:  Left message for pt. To call and make appointment.    Requested Prescriptions  Pending Prescriptions Disp Refills   valsartan-hydrochlorothiazide (DIOVAN-HCT) 80-12.5 MG tablet [Pharmacy Med Name: Valsartan-hydroCHLOROthiazide 80-12.5 MG Oral Tablet] 30 tablet 0    Sig: Take 2 tablets by mouth once daily      Cardiovascular: ARB + Diuretic Combos Failed - 12/31/2019  3:44 PM      Failed - K in normal range and within 180 days    Potassium  Date Value Ref Range Status  12/28/2018 3.8 3.5 - 5.1 mmol/L Final          Failed - Na in normal range and within 180 days    Sodium  Date Value Ref Range Status  12/28/2018 132 (L) 135 - 145 mmol/L Final  12/11/2018 138 137 - 147 Final          Failed - Cr in normal range and within 180 days    Creatinine, Ser  Date Value Ref Range Status  12/28/2018 0.88 0.61 - 1.24 mg/dL Final          Failed - Ca in normal range and within 180 days    Calcium  Date Value Ref Range Status  12/28/2018 9.8 8.9 - 10.3 mg/dL Final          Failed - Last BP in normal range    BP Readings from Last 1 Encounters:  05/28/19 (!) 142/84          Failed - Valid encounter within last 6 months    Recent Outpatient Visits           7 months ago Essential hypertension   [facility] Duanne Limerick, MD   8 months ago Establishing care with new doctor, encounter for   [facility] Duanne Limerick, MD              Passed - Patient is not pregnant

## 2020-01-03 ENCOUNTER — Other Ambulatory Visit: Payer: Self-pay

## 2020-01-03 ENCOUNTER — Other Ambulatory Visit: Payer: Self-pay | Admitting: Family Medicine

## 2020-01-03 DIAGNOSIS — I10 Essential (primary) hypertension: Secondary | ICD-10-CM

## 2020-01-03 MED ORDER — VALSARTAN-HYDROCHLOROTHIAZIDE 80-12.5 MG PO TABS: 2.0000 | ORAL_TABLET | Freq: Every day | ORAL | 0 refills | Status: AC

## 2020-01-03 NOTE — Telephone Encounter (Signed)
Sent in 2 week to [facility] and told them that [place] [facility] will be asking for  a transfer.

## 2020-01-03 NOTE — Telephone Encounter (Signed)
Patient called about his refill on Valsartan-hydrochlorothiazide. I advised it was sent to the [facility] in [place]. He says he has moved to [place], [place] and will need the prescription sent there. I placed him on hold and spoke to [place], Pensions consultant at [facility] in [place] about the prescription, she released it so the [facility] in [place] will be able to pull it. I advised the patient and he placed the pharmacist on the line who verified he can fill it now. I advised the patient the refill was for 15 days and since he's living in [place] now, he will need to find a provider there to follow for his medication refills. Advised to go to an urgent care if an appointment is not made within 15 days, he verbalized understanding.

## 2020-01-03 NOTE — Telephone Encounter (Signed)
Requested medication (s) are due for refill today: no  Requested medication (s) are on the active medication list: yes  Last refill:  12/16/19  Future visit scheduled: no Pt is overdue and last refill was a courtesy RF  Notes to clinic:  Pt working in [place] an wants a refill sent to pharmacy in [place]. Sending to provider to review and advise.   Requested Prescriptions  Pending Prescriptions Disp Refills   valsartan-hydrochlorothiazide (DIOVAN-HCT) 80-12.5 MG tablet 30 tablet 0    Sig: Take 2 tablets by mouth daily.      Cardiovascular: ARB + Diuretic Combos Failed - 01/03/2020  9:47 AM      Failed - K in normal range and within 180 days    Potassium  Date Value Ref Range Status  12/28/2018 3.8 3.5 - 5.1 mmol/L Final          Failed - Na in normal range and within 180 days    Sodium  Date Value Ref Range Status  12/28/2018 132 (L) 135 - 145 mmol/L Final  12/11/2018 138 137 - 147 Final          Failed - Cr in normal range and within 180 days    Creatinine, Ser  Date Value Ref Range Status  12/28/2018 0.88 0.61 - 1.24 mg/dL Final          Failed - Ca in normal range and within 180 days    Calcium  Date Value Ref Range Status  12/28/2018 9.8 8.9 - 10.3 mg/dL Final          Failed - Last BP in normal range    BP Readings from Last 1 Encounters:  05/28/19 (!) 142/84          Failed - Valid encounter within last 6 months    Recent Outpatient Visits           7 months ago Essential hypertension   [facility] Duanne Limerick, MD   8 months ago Establishing care with new doctor, encounter for   [facility] Duanne Limerick, MD              Passed - Patient is not pregnant

## 2020-01-03 NOTE — Telephone Encounter (Signed)
Patient called in upset stating he is at the pharmacy and he was called stating the medication is sent in. Medication is not sent in and patient became very agitated stating that he needs someone to call in his medication so he can live. Notified patient of typical protocol time and that we have marked it a high priority. Patient became more upset and stated office does not care and he will not continue to try and convince [place] that he needs this medication. When attempted to transfer to office patient disconnected. Please advise as patient is wanting to speak to Dr.Jones and have it filled now.

## 2020-01-03 NOTE — Telephone Encounter (Signed)
Refill request for Diovan-HCTZ; no valid encounter in last 6 months; upcoming appts noted; last seen 05/18/2019;  attempted to contact pt; left message on voicemail.  Requested medication (s) are due for refill today: yes  Requested medication (s) are on the active medication list: yes  Last refill:  12/16/19  Future visit scheduled: no Notes to clinic: no valid encounter within last 6 months; request pending from 12/31/19

## 2020-01-03 NOTE — Telephone Encounter (Signed)
Patient called in inquiring about his medication. Patient is completely out and is asking that it be filled today. Patient stated he is unable to come in for an appointment as he has moved to [place] due to work. Patient wanting to speak with Anthony Padilla to discuss this further and get medication filled. Please advise.

## 2020-01-03 NOTE — Telephone Encounter (Signed)
PT needs a refill / his in [place] for work  valsartan-hydrochlorothiazide (DIOVAN-HCT) 80-12.5 MG tablet [[id number]]  [facility] [address] [place] [address]  [address] [place] [address]  Phone: [phone] Fax: [phone]

## 2020-01-03 NOTE — Telephone Encounter (Signed)
Message noted.

## 2020-01-03 NOTE — Telephone Encounter (Signed)
This is the patient that we needed to see before June- I think you tried to call him and couldn't get an answer. Please call him again and schedule for this week

## 2020-01-07 DIAGNOSIS — E785 Hyperlipidemia, unspecified: Secondary | ICD-10-CM | POA: Diagnosis not present

## 2020-01-07 DIAGNOSIS — I10 Essential (primary) hypertension: Secondary | ICD-10-CM | POA: Diagnosis not present

## 2020-01-07 DIAGNOSIS — Z0000 Encounter for general adult medical examination without abnormal findings: Secondary | ICD-10-CM | POA: Diagnosis not present

## 2020-01-28 DIAGNOSIS — E785 Hyperlipidemia, unspecified: Secondary | ICD-10-CM | POA: Diagnosis not present

## 2020-01-28 DIAGNOSIS — F419 Anxiety disorder, unspecified: Secondary | ICD-10-CM | POA: Diagnosis not present

## 2020-01-28 DIAGNOSIS — I10 Essential (primary) hypertension: Secondary | ICD-10-CM | POA: Diagnosis not present

## 2020-02-09 DIAGNOSIS — E785 Hyperlipidemia, unspecified: Secondary | ICD-10-CM | POA: Diagnosis not present

## 2020-02-09 DIAGNOSIS — F419 Anxiety disorder, unspecified: Secondary | ICD-10-CM | POA: Diagnosis not present

## 2020-02-09 DIAGNOSIS — I10 Essential (primary) hypertension: Secondary | ICD-10-CM | POA: Diagnosis not present

## 2020-05-02 DIAGNOSIS — E785 Hyperlipidemia, unspecified: Secondary | ICD-10-CM | POA: Diagnosis not present

## 2020-05-02 DIAGNOSIS — Z0000 Encounter for general adult medical examination without abnormal findings: Secondary | ICD-10-CM | POA: Diagnosis not present

## 2020-05-02 DIAGNOSIS — F419 Anxiety disorder, unspecified: Secondary | ICD-10-CM | POA: Diagnosis not present

## 2020-05-02 DIAGNOSIS — I10 Essential (primary) hypertension: Secondary | ICD-10-CM | POA: Diagnosis not present

## 2020-08-01 DIAGNOSIS — F419 Anxiety disorder, unspecified: Secondary | ICD-10-CM | POA: Diagnosis not present

## 2020-08-01 DIAGNOSIS — I10 Essential (primary) hypertension: Secondary | ICD-10-CM | POA: Diagnosis not present

## 2020-08-01 DIAGNOSIS — E785 Hyperlipidemia, unspecified: Secondary | ICD-10-CM | POA: Diagnosis not present

## 2020-09-07 DIAGNOSIS — E785 Hyperlipidemia, unspecified: Secondary | ICD-10-CM | POA: Diagnosis not present

## 2020-09-07 DIAGNOSIS — I10 Essential (primary) hypertension: Secondary | ICD-10-CM | POA: Diagnosis not present

## 2020-09-07 DIAGNOSIS — F419 Anxiety disorder, unspecified: Secondary | ICD-10-CM | POA: Diagnosis not present

## 2020-09-18 DIAGNOSIS — I10 Essential (primary) hypertension: Secondary | ICD-10-CM | POA: Diagnosis not present

## 2020-09-18 DIAGNOSIS — E785 Hyperlipidemia, unspecified: Secondary | ICD-10-CM | POA: Diagnosis not present

## 2020-09-18 DIAGNOSIS — R5383 Other fatigue: Secondary | ICD-10-CM | POA: Diagnosis not present

## 2020-09-18 DIAGNOSIS — Z0000 Encounter for general adult medical examination without abnormal findings: Secondary | ICD-10-CM | POA: Diagnosis not present
